# Patient Record
Sex: Female | Born: 1970 | Hispanic: Yes | Marital: Married | Smoking: Never smoker
Health system: Southern US, Community
[De-identification: ages and names within clinical notes are randomized; demographics above are authoritative.]

## PROBLEM LIST (undated history)

## (undated) DIAGNOSIS — R4702 Dysphasia: Secondary | ICD-10-CM

## (undated) HISTORY — PX: DIAGNOSTIC LAPAROSCOPY: SUR761

---

## 2008-12-10 ENCOUNTER — Ambulatory Visit: Payer: Self-pay | Admitting: Obstetrics and Gynecology

## 2009-09-14 ENCOUNTER — Ambulatory Visit: Payer: Self-pay | Admitting: Family Medicine

## 2010-01-08 ENCOUNTER — Ambulatory Visit: Payer: Self-pay | Admitting: Gastroenterology

## 2010-08-30 ENCOUNTER — Ambulatory Visit: Payer: Self-pay | Admitting: Obstetrics and Gynecology

## 2013-12-17 DIAGNOSIS — K589 Irritable bowel syndrome without diarrhea: Secondary | ICD-10-CM | POA: Insufficient documentation

## 2013-12-17 DIAGNOSIS — K2970 Gastritis, unspecified, without bleeding: Secondary | ICD-10-CM | POA: Insufficient documentation

## 2013-12-17 DIAGNOSIS — N809 Endometriosis, unspecified: Secondary | ICD-10-CM | POA: Insufficient documentation

## 2014-01-10 ENCOUNTER — Ambulatory Visit: Payer: Self-pay

## 2014-12-24 ENCOUNTER — Other Ambulatory Visit: Payer: Self-pay | Admitting: Obstetrics and Gynecology

## 2014-12-24 DIAGNOSIS — Z1231 Encounter for screening mammogram for malignant neoplasm of breast: Secondary | ICD-10-CM

## 2014-12-24 DIAGNOSIS — M6281 Muscle weakness (generalized): Secondary | ICD-10-CM | POA: Insufficient documentation

## 2015-01-12 ENCOUNTER — Ambulatory Visit
Admission: RE | Admit: 2015-01-12 | Discharge: 2015-01-12 | Disposition: A | Discharge status: Home / Self Care | Payer: BLUE CROSS/BLUE SHIELD | Source: Ambulatory Visit | Attending: Obstetrics and Gynecology | Admitting: Obstetrics and Gynecology

## 2015-01-12 DIAGNOSIS — R928 Other abnormal and inconclusive findings on diagnostic imaging of breast: Secondary | ICD-10-CM | POA: Insufficient documentation

## 2015-01-12 DIAGNOSIS — Z1231 Encounter for screening mammogram for malignant neoplasm of breast: Secondary | ICD-10-CM | POA: Diagnosis present

## 2015-01-21 ENCOUNTER — Other Ambulatory Visit: Payer: Self-pay | Admitting: Obstetrics and Gynecology

## 2015-01-21 DIAGNOSIS — R928 Other abnormal and inconclusive findings on diagnostic imaging of breast: Secondary | ICD-10-CM

## 2015-01-21 DIAGNOSIS — N6489 Other specified disorders of breast: Secondary | ICD-10-CM

## 2015-01-23 ENCOUNTER — Ambulatory Visit
Admission: RE | Admit: 2015-01-23 | Discharge: 2015-01-23 | Disposition: A | Discharge status: Home / Self Care | Payer: BLUE CROSS/BLUE SHIELD | Source: Ambulatory Visit | Attending: Obstetrics and Gynecology | Admitting: Obstetrics and Gynecology

## 2015-01-23 ENCOUNTER — Ambulatory Visit: Payer: BLUE CROSS/BLUE SHIELD

## 2015-01-23 DIAGNOSIS — N6489 Other specified disorders of breast: Secondary | ICD-10-CM

## 2015-01-23 DIAGNOSIS — R928 Other abnormal and inconclusive findings on diagnostic imaging of breast: Secondary | ICD-10-CM | POA: Diagnosis not present

## 2017-03-23 ENCOUNTER — Other Ambulatory Visit: Payer: Self-pay | Admitting: Obstetrics and Gynecology

## 2017-03-23 DIAGNOSIS — Z1231 Encounter for screening mammogram for malignant neoplasm of breast: Secondary | ICD-10-CM

## 2017-05-03 ENCOUNTER — Ambulatory Visit
Admission: RE | Admit: 2017-05-03 | Discharge: 2017-05-03 | Disposition: A | Discharge status: Home / Self Care | Payer: BLUE CROSS/BLUE SHIELD | Source: Ambulatory Visit | Attending: Obstetrics and Gynecology | Admitting: Obstetrics and Gynecology

## 2017-05-03 DIAGNOSIS — Z1231 Encounter for screening mammogram for malignant neoplasm of breast: Secondary | ICD-10-CM | POA: Diagnosis not present

## 2018-03-30 ENCOUNTER — Other Ambulatory Visit: Payer: Self-pay | Admitting: Obstetrics and Gynecology

## 2018-03-30 DIAGNOSIS — Z1231 Encounter for screening mammogram for malignant neoplasm of breast: Secondary | ICD-10-CM

## 2018-05-08 ENCOUNTER — Ambulatory Visit
Admission: RE | Admit: 2018-05-08 | Discharge: 2018-05-08 | Disposition: A | Discharge status: Home / Self Care | Payer: BLUE CROSS/BLUE SHIELD | Source: Ambulatory Visit | Attending: Obstetrics and Gynecology | Admitting: Obstetrics and Gynecology

## 2018-05-08 DIAGNOSIS — Z1231 Encounter for screening mammogram for malignant neoplasm of breast: Secondary | ICD-10-CM | POA: Insufficient documentation

## 2019-04-08 ENCOUNTER — Other Ambulatory Visit: Payer: Self-pay | Admitting: Obstetrics and Gynecology

## 2019-04-08 DIAGNOSIS — Z1231 Encounter for screening mammogram for malignant neoplasm of breast: Secondary | ICD-10-CM

## 2019-05-16 ENCOUNTER — Ambulatory Visit
Admission: RE | Admit: 2019-05-16 | Discharge: 2019-05-16 | Disposition: A | Discharge status: Home / Self Care | Payer: BC Managed Care – PPO | Source: Ambulatory Visit | Attending: Obstetrics and Gynecology | Admitting: Obstetrics and Gynecology

## 2019-05-16 DIAGNOSIS — Z1231 Encounter for screening mammogram for malignant neoplasm of breast: Secondary | ICD-10-CM | POA: Diagnosis present

## 2019-08-29 ENCOUNTER — Ambulatory Visit: Payer: BC Managed Care – PPO | Attending: Family

## 2019-08-29 ENCOUNTER — Ambulatory Visit: Payer: Self-pay

## 2019-08-29 DIAGNOSIS — Z23 Encounter for immunization: Secondary | ICD-10-CM

## 2019-08-29 NOTE — Progress Notes (Signed)
   Covid-19 Vaccination Clinic  Name:  Lynn Adams    MRN: [id number] DOB: 12/24/1970  08/29/2019  Ms. Lynn Adams was observed post Covid-19 immunization for 15 minutes without incident. She was provided with Vaccine Information Sheet and instruction to access the V-Safe system.   Ms. Lynn Adams was instructed to call 911 with any severe reactions post vaccine: Marland Kitchen Difficulty breathing  . Swelling of face and throat  . A fast heartbeat  . A bad rash all over body  . Dizziness and weakness   Immunizations Administered    Name Date Dose VIS Date Route   Moderna COVID-19 Vaccine 08/29/2019 11:07 AM 0.5 mL 05/28/2019 Intramuscular   Manufacturer: Moderna   Lot: [id number]   NDC: 62376-283-15

## 2019-10-01 ENCOUNTER — Ambulatory Visit: Payer: BC Managed Care – PPO | Attending: Family

## 2019-10-01 ENCOUNTER — Ambulatory Visit: Payer: Self-pay

## 2019-10-01 DIAGNOSIS — Z23 Encounter for immunization: Secondary | ICD-10-CM

## 2019-10-01 NOTE — Progress Notes (Signed)
   Covid-19 Vaccination Clinic  Name:  Lynn Adams    MRN: [id number] DOB: Oct 29, 1970  10/01/2019  Ms. Lynn Adams was observed post Covid-19 immunization for 15 minutes without incident. She was provided with Vaccine Information Sheet and instruction to access the V-Safe system.   Ms. Lynn Adams was instructed to call 911 with any severe reactions post vaccine: Marland Kitchen Difficulty breathing  . Swelling of face and throat  . A fast heartbeat  . A bad rash all over body  . Dizziness and weakness   Immunizations Administered    Name Date Dose VIS Date Route   Moderna COVID-19 Vaccine 10/01/2019  9:36 AM 0.5 mL 05/28/2019 Intramuscular   Manufacturer: Moderna   Lot: [id number]   NDC: 93734-287-68

## 2020-05-28 ENCOUNTER — Other Ambulatory Visit: Payer: Self-pay | Admitting: Obstetrics and Gynecology

## 2020-05-28 DIAGNOSIS — Z1231 Encounter for screening mammogram for malignant neoplasm of breast: Secondary | ICD-10-CM

## 2020-07-09 ENCOUNTER — Ambulatory Visit
Admission: RE | Admit: 2020-07-09 | Discharge: 2020-07-09 | Disposition: A | Discharge status: Home / Self Care | Payer: No Typology Code available for payment source | Source: Ambulatory Visit | Attending: Obstetrics and Gynecology | Admitting: Obstetrics and Gynecology

## 2020-07-09 ENCOUNTER — Other Ambulatory Visit: Payer: Self-pay

## 2020-07-09 DIAGNOSIS — Z1231 Encounter for screening mammogram for malignant neoplasm of breast: Secondary | ICD-10-CM | POA: Diagnosis not present

## 2021-01-26 ENCOUNTER — Ambulatory Visit: Payer: No Typology Code available for payment source | Admitting: Gastroenterology

## 2021-01-26 ENCOUNTER — Other Ambulatory Visit: Payer: Self-pay

## 2021-01-26 ENCOUNTER — Encounter: Payer: Self-pay | Admitting: Gastroenterology

## 2021-01-26 VITALS — BP 117/77 | HR 59 | Temp 98.4°F | Ht 65.0 in | Wt 115.6 lb

## 2021-01-26 DIAGNOSIS — R1084 Generalized abdominal pain: Secondary | ICD-10-CM

## 2021-01-26 DIAGNOSIS — R102 Pelvic and perineal pain: Secondary | ICD-10-CM | POA: Insufficient documentation

## 2021-01-26 DIAGNOSIS — K5900 Constipation, unspecified: Secondary | ICD-10-CM

## 2021-01-26 DIAGNOSIS — R0600 Dyspnea, unspecified: Secondary | ICD-10-CM

## 2021-01-26 DIAGNOSIS — Z8371 Family history of colonic polyps: Secondary | ICD-10-CM | POA: Diagnosis not present

## 2021-01-26 DIAGNOSIS — N882 Stricture and stenosis of cervix uteri: Secondary | ICD-10-CM | POA: Insufficient documentation

## 2021-01-26 MED ORDER — CLENPIQ 10-3.5-12 MG-GM -GM/160ML PO SOLN: ORAL | 0 refills | Status: DC

## 2021-01-26 NOTE — Addendum Note (Signed)
Addended by: Adela Ports on: 01/26/2021 11:16 AM   Modules accepted: Orders

## 2021-01-26 NOTE — Patient Instructions (Addendum)
Please take Linzess 72 mcg daily in the morning 30 minutes before breakfast. Please give [place] a call in 4-5 days to let us know if this medication does not work.Plan de alimentacin con bajo contenido de FODMAP Low-FODMAP Eating Plan  FODMAP significa oligosacridos, disacridos, monosacridos y polioles fermentables. Son azcares difciles de digerir para International aid/development worker. Un plan de alimentacin con bajo contenido de FODMAP puede ayudar a algunas personas que tienen el sndrome de colon irritable (SCI) y Materials engineer enfermedades de los intestinos (intestinales) a Chief Operating Officer sus sntomas. Seguir [employer] plan de alimentacin puede resultar complicado. Consulte con un especialista en dietas y nutricin (nutricionista) para disear un plan de alimentacin con bajo contenido de FODMAP que sea adecuado para usted. Un nutricionista puede asegurarse de que consumasuficientes nutrientes con este plan de alimentacin. Consejos para seguir Consulting civil engineer las etiquetas de los alimentos Lea las etiquetas para detectar FODMAP ocultos, por ejemplo: Jarabe de alta fructosa. Miel. Agave. Saborizantes de frutas naturales. Ajo o cebolla en polvo. Elija alimentos con bajo contenido de FODMAP que contengan 3 o 4 gramos de fibra por porcin. Consulte las etiquetas de los alimentos para Solicitor los tamaos de las porciones. Coma solo una porcin por vez para asegurarse de [employer] niveles de FODMAP. De compras Compre con una lista de alimentos recomendados para esta dieta y haga un plan de comidas. Planificacin de las comidas Siga un plan de alimentacin con bajo contenido de FODMAP durante un mximo de 6 semanas, o segn le indique el mdico o nutricionista. Para seguir el plan de alimentacin, haga lo siguiente: Elimine los alimentos con alto contenido de FODMAP de su dieta por completo. Seleccione slo alimentos bajos en FODMAP para comer. Lo har durante 2 a 6 semanas. Vuelva a introducir los alimentos con alto  contenido de FODMAP en su dieta gradualmente, de uno a la vez. En su mayora, la gente debe esperar unos das antes de introducir la siguiente comida con alto contenido de FODMAP en su plan de comidas. Su nutricionista puede recomendarle con qu rapidez debera volver a introducir los alimentos. Lleve un registro diario de qu y cunto come y bebe. Anote cualquier sntoma que tenga despus de comer. Revise su registro diario con el nutricionista regularmente para identificar qu alimentos puede comer y Administrator. Consejos generales Beba suficiente lquido todos los [address] para mantener la orina de color amarillo plido. No consuma alimentos procesados. Con frecuencia, estos alimentos tienen azcar agregada y pueden tener un alto contenido de FODMAP. Evite la [employer] de los productos lcteos, los cereales integrales y los endulzantes. Consulte con un nutricionista para asegurarse de incluir suficiente fibra en su alimentacin. Evite los alimentos con alto contenido de FODMAP en las comidas para controlar los sntomas. Alimentos recomendados [address], [place], [place], limones, limas, arndanos, frambuesas, fresas, uvas, meln, meln dulce, kiwi, papaya, maracuy y pia. Cantidades limitadasde arndanos rojos disecados, chips de banana y coco rallado. Verduras [place], calabacn, pepino, pimientos, judas verdes, brotes de soja, [place], rcula, kale, acelga, espinaca, col berza, col [place], [place], papa y [place]. Cantidades limitadas de maz, zanahoria y camote. Laparte verde de los cebollines. Granos Cereales sin gluten, como arroz, avena, trigo sarraceno, quinua, maz, polentay mijo. Pasta, pan y cereal sin gluten. Fideos de Surveyor, minerals. Tortillas de maz. Carnes y [address] protenas Carne de res, cerdo, aves o pescado sin Education officer, museum. Huevos. Tocino. Tofu (firme) y tempeh. Cantidades limitadas de frutos secos y [address], como almendras, nueces, nueces de [place],  [place], [place],  semillas de calabaza, semillas decha y semillas de girasol. Earna Coder, yogur y Charity fundraiser sin Advice worker. Queso requesn y helado sin Advice worker. Leches que no sean de origen animal, por ejemplo, de almendras, coco, camo y arroz. Yogur no lcteo. Cantidades limitadas de queso de [place], [place], [place], suizo y [employer]. Grasas y aceites Pastas para untar sin manteca. Aceites vegetales, como el de [place], de canola yde [place]. Condimentos y otros alimentos Endulzantes artificiales con nombres que no terminen en "ol", como aspartamo, sacarina y [place]. Doreen Beam de arce, azcar blanca, azcar sin refinar, azcar morena y melaza. Mayonesa, salsa de soja y [place]. [place], cilantro, perejil,romero y tomillo frescos. Bebidas Westley Hummer y agua mineral. Refrescos endulzados con azcar. Pequeas cantidades de [place] de naranjas o jugo de arndanos rojos. T negro y verde. La mayora de losvinos secos. Caf. Es posible que los productos que se enumeran ms [place] no constituyan una lista completa de los alimentos y las bebidas que puede tomar. Consulte a un nutricionista para obtener ms informacin. Alimentos que deben evitarse Frutas [place], [place], sanda, durazno, [place], [place], [place], mora, mora de [place], nectarina y Horticulturist, commercial, en forma fresca, disecada o en jugo. Agu[place]. Verduras Raz de [place], alcachofa, esprrago, repollo, arvejas, repollitos de [place], brcoli, guisantes, arvejas, hongos, apio y Counsellor. Cebollas, ajo,puerros y la parte blanca de los cebollines. Granos [place]igo, incluidos el kamut, trigo duro y [place]. Cebada y bulgur. Cuscs.Cereales a base de trigo. Fideos de trigo, pan, galletas saladas y pasteles. Carnes y otras protenas Carnes fritas o grasosas. Salchichas. Castaas de caj y pistachos. Soja, frijoles en salsa de tomate, frijoles negros, garbanzos, porotos colorados,habas, porotos blancos, lentejas, frijoles pintos y arvejas  partidas. Lcteos Leche, yogur, helado y Bentley blando. Crema y Optometrist. Salsas a base deleche. Natillas. Suero de [place]. La leche de soja. Condimentos y otros alimentos Cualquier goma de Theatre manager o caramelo sin azcar. Alimentos que contienen endulzantes artificiales tales como el sorbitol, manitol, isomalt o xilitol. Alimentos que contienen miel, jarabe de maz de alta fructosa o agave. Consom, caldo de verduras, caldo de res y caldo de [place]. Ajo y cebolla en polvo. Condimentos hechos con cebolla, como el hummus, chutney, pickles, salsa depepinillos, aderezo para ensaladas y [place]. Extracto de [place]. Bebidas Bebidas a base de endivias. Sustitutos del caf. T de manzanilla. T de hinojo. Vinos dulces o enriquecidos como el oporto o el Visual merchandiser. Refrescos dietticos hechos con isomalt, manitol, maltitol, sorbitol o xilitol. Jugo demanzana, pera y mango. Jugos con Doreen Beam de maz de alta fructosa. Es posible que los productos que se enumeran ms [place] no constituyan una lista completa de los alimentos y las bebidas que Personnel officer. Consulte a un nutricionista para obtener ms informacin. Resumen FODMAP significa oligosacridos, disacridos, monosacridos y polioles fermentables. Son azcares difciles de digerir para International aid/development worker. Un plan de alimentacin con bajo contenido de FODMAP es una dieta a corto plazo que ayuda a Paramedic los sntomas de ciertas enfermedades intestinales. Generalmente, el plan de alimentacin dura hasta 6 semanas. A continuacin, los alimentos con alto contenido de FODMAP se reintroducen gradualmente y de a uno. Esto puede ayudarle a Financial risk analyst qu alimentos pueden estar causando los sntomas. Un plan de alimentacin con bajo contenido de FODMAP puede ser complicado. Es aconsejable consultar a un nutricionista con experiencia en este tipo de plan. Esta informacin no tiene Theme park manager el consejo del mdico. Asegresede hacerle al mdico cualquier pregunta que  tenga. Document Revised: 12/20/2019 Document Reviewed: 12/20/2019 Elsevier Patient Education  2022 [employer].

## 2021-01-26 NOTE — Progress Notes (Signed)
[name] MD, MRCP(U.K) [address]  [address]  [place], [place] [address]  Main: [phone]  Fax: [phone]   Gastroenterology Consultation  Referring Provider:     Christeen Douglas, MD Primary Care Physician:  Christeen Douglas, MD Primary Gastroenterologist:  Dr. [name]  Reason for Consultation:     Dyspepsia and bloating        HPI:   Lynn Adams is a 50 y.o. y/o female referred for consultation & management  by Dr. Christeen Douglas, MD.    She is originally from [place] and the reason she is here today to see me is for bloating she states that she has had issues with bloating for many years.  She states that she develops bloating first thing in the morning when she wakes up appears that she looks pregnant.  Often relieved after a bowel movement but she does not have a bowel movement daily.  She does notice that her bloating symptoms are worse when she is constipated.  Has not tried any medications for the same.  Last colonoscopy many years back.  Father had colon polyps.  She recollect she has had H. pylori in the past.  She complains of epigastric pain nonradiating sharp in nature ongoing for many years.  Worse when she eats.  Denies any NSAID use.  Denies any weight loss.  No other GI symptoms.  No past medical history on file.  No past surgical history on file.  Prior to Admission medications   Not on File    Family History  Problem Relation Age of Onset   Breast cancer Neg Hx      Social History   Tobacco Use   Smoking status: Never    Passive exposure: Never   Smokeless tobacco: Never  Substance Use Topics   Alcohol use: Not Currently    Allergies as of 01/26/2021   (No Known Allergies)    Review of Systems:    All systems reviewed and negative except where noted in HPI.   Physical Exam:  BP 117/77   Pulse (!) 59   Temp 98.4 F (36.9 C) (Oral)   Ht 5\' 5"  (1.651 m)   Wt 115 lb 9.6 oz (52.4 kg)   BMI 19.24 kg/m  No LMP  recorded. (Menstrual status: IUD). Psych:  Alert and cooperative. Normal mood and affect. General:   Alert,  Well-developed, well-nourished, pleasant and cooperative in NAD Head:  Normocephalic and atraumatic. Eyes:  Sclera clear, no icterus.   Conjunctiva pink. Ears:  Normal auditory acuity. Lungs:  Respirations even and unlabored.  Clear throughout to auscultation.   No wheezes, crackles, or rhonchi. No acute distress. Heart:  Regular rate and rhythm; no murmurs, clicks, rubs, or gallops. Abdomen:  Normal bowel sounds.  No bruits.  Soft, non-tender and non-distended without masses, hepatosplenomegaly or hernias noted.  No guarding or rebound tenderness.    Neurologic:  Alert and oriented x3;  grossly normal neurologically. Psych:  Alert and cooperative. Normal mood and affect.  Imaging Studies: No results found.  Assessment and Plan:   Lynn Adams is a 50 y.o. y/o female has been referred for dyspepsia and bloating.  Very likely her symptoms of bloating are secondary to constipation.  And dyspepsia could be related to her H. pylori but has been going on for many months and would warrant evaluation.  I believe if her constipation is treated adequately her bloating symptoms would reduce.  Family history of  colon polyps.  Overdue for colonoscopy  Plan 1.  Low FODMAP diet 2.  Commenced on Linzess 72 mcg daily samples have been provided for 2 weeks. 3.  H. pylori breath test 4.  EGD and colonoscopy due to family history of colon polyps   I have discussed alternative options, risks & benefits,  which include, but are not limited to, bleeding, infection, perforation,respiratory complication & drug reaction.  The patient agrees with this plan & written consent will be obtained.    Follow up in 8 to 12 weeks  Dr [name] MD,MRCP(U.K)

## 2021-01-28 LAB — H. PYLORI BREATH TEST: H pylori Breath Test: NEGATIVE

## 2021-01-28 LAB — CELIAC DISEASE PANEL
Endomysial IgA: NEGATIVE
IgA/Immunoglobulin A, Serum: 234 mg/dL (ref 87–352)
Transglutaminase IgA: <2 U/mL (ref 0–3)

## 2021-03-23 ENCOUNTER — Telehealth: Payer: Self-pay

## 2021-03-23 NOTE — Telephone Encounter (Signed)
Pt. Would like to reschedule colonoscopy

## 2021-03-24 ENCOUNTER — Telehealth: Payer: Self-pay

## 2021-03-24 NOTE — Telephone Encounter (Signed)
CALLED PATIENT BACK SHE WANTED TO RESCHEDULE APPOINTMENT TO 04/15/2021 CALLED ENDO AND SENT NEW REFERREL TO [place]

## 2021-03-30 ENCOUNTER — Telehealth: Payer: Self-pay | Admitting: Gastroenterology

## 2021-03-30 NOTE — Telephone Encounter (Signed)
Pt. Requesting a call back she says her insurance will only cover her colonoscopy 100% and will only cover her Endoscopy 80% she wants to know is the endoscopy for prevention or diagnostic reasons.

## 2021-03-31 NOTE — Telephone Encounter (Signed)
Called patient back to let her know that her colonoscopy is for screening colonoscopy and her EGD is for abdominal bloating (diagnostic). So to please call us back in case she wanted to cancel the EGD or not.

## 2021-04-06 ENCOUNTER — Ambulatory Visit: Payer: No Typology Code available for payment source | Admitting: Gastroenterology

## 2021-04-15 ENCOUNTER — Ambulatory Visit
Admission: RE | Admit: 2021-04-15 | Payer: No Typology Code available for payment source | Source: Ambulatory Visit | Admitting: Gastroenterology

## 2021-04-15 ENCOUNTER — Encounter: Admission: RE | Payer: Self-pay | Source: Ambulatory Visit

## 2021-04-15 SURGERY — COLONOSCOPY WITH PROPOFOL
Anesthesia: General

## 2022-04-08 ENCOUNTER — Other Ambulatory Visit: Payer: Self-pay | Admitting: Obstetrics and Gynecology

## 2022-04-08 DIAGNOSIS — Z1231 Encounter for screening mammogram for malignant neoplasm of breast: Secondary | ICD-10-CM

## 2022-04-18 ENCOUNTER — Other Ambulatory Visit: Payer: Self-pay

## 2022-04-18 ENCOUNTER — Encounter: Payer: Self-pay | Admitting: Emergency Medicine

## 2022-04-18 ENCOUNTER — Emergency Department: Payer: No Typology Code available for payment source

## 2022-04-18 ENCOUNTER — Emergency Department
Admission: EM | Admit: 2022-04-18 | Discharge: 2022-04-18 | Disposition: A | Discharge status: Home / Self Care | Payer: No Typology Code available for payment source | Attending: Emergency Medicine | Admitting: Emergency Medicine

## 2022-04-18 DIAGNOSIS — R1013 Epigastric pain: Secondary | ICD-10-CM | POA: Diagnosis present

## 2022-04-18 DIAGNOSIS — K2900 Acute gastritis without bleeding: Secondary | ICD-10-CM | POA: Insufficient documentation

## 2022-04-18 LAB — CBC
HCT: 41.1 % (ref 36.0–46.0)
Hemoglobin: 13.4 g/dL (ref 12.0–15.0)
MCH: 31.2 pg (ref 26.0–34.0)
MCHC: 32.6 g/dL (ref 30.0–36.0)
MCV: 95.8 fL (ref 80.0–100.0)
Platelets: 291 10*3/uL (ref 150–400)
RBC: 4.29 MIL/uL (ref 3.87–5.11)
RDW: 12.2 % (ref 11.5–15.5)
WBC: 6.9 10*3/uL (ref 4.0–10.5)
nRBC: 0 % (ref 0.0–0.2)

## 2022-04-18 LAB — COMPREHENSIVE METABOLIC PANEL
ALT: 49 U/L — ABNORMAL HIGH (ref 0–44)
AST: 18 U/L (ref 15–41)
Albumin: 4.4 g/dL (ref 3.5–5.0)
Alkaline Phosphatase: 45 U/L (ref 38–126)
Anion gap: 6 (ref 5–15)
BUN: 10 mg/dL (ref 6–20)
CO2: 26 mmol/L (ref 22–32)
Calcium: 9.9 mg/dL (ref 8.9–10.3)
Chloride: 109 mmol/L (ref 98–111)
Creatinine, Ser: 0.49 mg/dL (ref 0.44–1.00)
GFR, Estimated: >60 mL/min (ref >60)
Glucose, Bld: 101 mg/dL — ABNORMAL HIGH (ref 70–99)
Potassium: 3.9 mmol/L (ref 3.5–5.1)
Sodium: 141 mmol/L (ref 135–145)
Total Bilirubin: 0.7 mg/dL (ref 0.3–1.2)
Total Protein: 7.7 g/dL (ref 6.5–8.1)

## 2022-04-18 LAB — URINALYSIS, ROUTINE W REFLEX MICROSCOPIC
Bilirubin Urine: NEGATIVE
Glucose, UA: NEGATIVE mg/dL
Hgb urine dipstick: NEGATIVE
Ketones, ur: NEGATIVE mg/dL
Leukocytes,Ua: NEGATIVE
Nitrite: NEGATIVE
Protein, ur: NEGATIVE mg/dL
Specific Gravity, Urine: 1.001 — ABNORMAL LOW (ref 1.005–1.030)
pH: 8 (ref 5.0–8.0)

## 2022-04-18 LAB — POC URINE PREG, ED: Preg Test, Ur: NEGATIVE

## 2022-04-18 LAB — LIPASE, BLOOD: Lipase: 64 U/L — ABNORMAL HIGH (ref 11–51)

## 2022-04-18 MED ORDER — SUCRALFATE 1 G PO TABS: 1.0000 g | ORAL_TABLET | Freq: Three times a day (TID) | ORAL | 0 refills | Status: DC

## 2022-04-18 MED ORDER — PANTOPRAZOLE SODIUM 40 MG PO TBEC: 40.0000 mg | DELAYED_RELEASE_TABLET | Freq: Every day | ORAL | 1 refills | Status: DC

## 2022-04-18 MED ORDER — ALUM & MAG HYDROXIDE-SIMETH 200-200-20 MG/5ML PO SUSP
30.0000 mL | Freq: Once | ORAL | Status: AC
  Administered 2022-04-18: 30 mL via ORAL
  Filled 2022-04-18: qty 30

## 2022-04-18 MED ORDER — IOHEXOL 300 MG/ML  SOLN
100.0000 mL | Freq: Once | INTRAMUSCULAR | Status: AC | PRN
  Administered 2022-04-18: 100 mL via INTRAVENOUS

## 2022-04-18 MED ORDER — KETOROLAC TROMETHAMINE 30 MG/ML IJ SOLN
30.0000 mg | Freq: Once | INTRAMUSCULAR | Status: AC
  Administered 2022-04-18: 30 mg via INTRAVENOUS
  Filled 2022-04-18: qty 1

## 2022-04-18 MED ORDER — ONDANSETRON HCL 4 MG/2ML IJ SOLN
4.0000 mg | Freq: Once | INTRAMUSCULAR | Status: AC
  Administered 2022-04-18: 4 mg via INTRAVENOUS
  Filled 2022-04-18: qty 2

## 2022-04-18 MED ORDER — MORPHINE SULFATE (PF) 4 MG/ML IV SOLN
4.0000 mg | Freq: Once | INTRAVENOUS | Status: DC
  Filled 2022-04-18: qty 1

## 2022-04-18 NOTE — ED Provider Notes (Signed)
[facility] Provider Note    Event Date/Time   First MD Initiated Contact with Patient 04/18/22 [phone]     (approximate)   History   Abdominal Pain   HPI  Lynn Adams is a 51 y.o. female who presents with complaints of epigastric abdominal pain.  Patient reports that started last night, waxed and waned all night long, on pain scale from 7-10.  No history of abdominal surgeries.  She has had bloating before but nothing like this.  She does state it radiates to her right shoulder blade.     Physical Exam   Triage Vital Signs: ED Triage Vitals  Enc Vitals Group     BP 04/18/22 0841 131/76     Pulse Rate 04/18/22 0841 79     Resp 04/18/22 0841 20     Temp 04/18/22 0841 98.7 F (37.1 C)     Temp src --      SpO2 04/18/22 0841 99 %     Weight 04/18/22 0840 58.5 kg (129 lb)     Height 04/18/22 0840 1.575 m (5\' 2" )     Head Circumference --      Peak Flow --      Pain Score 04/18/22 0840 6     Pain Loc --      Pain Edu? --      Excl. in [place]? --     Most recent vital signs: Vitals:   04/18/22 0841  BP: 131/76  Pulse: 79  Resp: 20  Temp: 98.7 F (37.1 C)  SpO2: 99%     General: Awake, no distress.  CV:  Good peripheral perfusion.  Resp:  Normal effort.  Abd:  No distention.  Tenderness palpation the right upper quadrant Other:     ED Results / Procedures / Treatments   Labs (all labs ordered are listed, but only abnormal results are displayed) Labs Reviewed  LIPASE, BLOOD - Abnormal; Notable for the following components:      Result Value   Lipase 64 (*)    All other components within normal limits  COMPREHENSIVE METABOLIC PANEL - Abnormal; Notable for the following components:   Glucose, Bld 101 (*)    ALT 49 (*)    All other components within normal limits  URINALYSIS, ROUTINE W REFLEX MICROSCOPIC - Abnormal; Notable for the following components:   Color, Urine COLORLESS (*)    APPearance CLEAR (*)    Specific  Gravity, Urine 1.001 (*)    All other components within normal limits  CBC  POC URINE PREG, ED     EKG  ED ECG REPORT I, Lavonia Drafts, the attending physician, personally viewed and interpreted this ECG.  Date: 04/18/2022  Rhythm: normal sinus rhythm QRS Axis: normal Intervals: normal ST/T Wave abnormalities: normal Narrative Interpretation: no evidence of acute ischemia    RADIOLOGY Ultrasound right upper quadrant viewed interpreted by me, no evidence of cholecystitis    PROCEDURES:  Critical Care performed:   Procedures   MEDICATIONS ORDERED IN ED: Medications  ondansetron (ZOFRAN) injection 4 mg (4 mg Intravenous Given 04/18/22 0910)  ketorolac (TORADOL) 30 MG/ML injection 30 mg (30 mg Intravenous Given 04/18/22 0922)  iohexol (OMNIPAQUE) 300 MG/ML solution 100 mL (100 mLs Intravenous Contrast Given 04/18/22 1004)  alum & mag hydroxide-simeth (MAALOX/MYLANTA) 200-200-20 MG/5ML suspension 30 mL (30 mLs Oral Given 04/18/22 1029)     IMPRESSION / MDM / ASSESSMENT AND PLAN / ED COURSE  I reviewed the triage  vital signs and the nursing notes. Patient's presentation is most consistent with acute presentation with potential threat to life or bodily function.  Patient presents with epigastric pain as described above.  Differential includes cholelithiasis/cholecystitis, pancreatitis, gastritis.  Tenderness palpation the right upper quadrant with radiation to the right shoulder blade, highly suspicious for gallbladder pathology.  Will give IV morphine, IV Zofran, obtain ultrasound of the right upper quadrant  CBC is returned normal, pending CMP  Ultrasound is negative for gallstones, mildly elevated lipase will send for CT to evaluate for possible pancreatitis  No evidence of inflammation on CT, LFTs are overall reassuring.  Patient treated with GI cocktail with significant improvement.  Suspicious for gastritis.  Will start on PPI, Carafate, outpatient follow-up  with GI, return precautions discussed, patient and family agree with this plan        FINAL CLINICAL IMPRESSION(S) / ED DIAGNOSES   Final diagnoses:  Acute gastritis without hemorrhage, unspecified gastritis type     Rx / DC Orders   ED Discharge Orders          Ordered    pantoprazole (PROTONIX) 40 MG tablet  Daily        04/18/22 1043    sucralfate (CARAFATE) 1 g tablet  3 times daily with meals & bedtime        04/18/22 1043             Note:  This document was prepared using Dragon voice recognition software and may include unintentional dictation errors.   Jene Every, MD 04/18/22 1045

## 2022-04-18 NOTE — ED Triage Notes (Signed)
Pt via POV from home. Pt c/o epigastric pain since last night that radiates around the R side. Denies abd surgeries. Denies any NV. Denies fever. Denies urinary symptoms. Pt is A&OX4 and NAD

## 2022-05-12 ENCOUNTER — Ambulatory Visit
Admission: RE | Admit: 2022-05-12 | Discharge: 2022-05-12 | Disposition: A | Discharge status: Home / Self Care | Payer: No Typology Code available for payment source | Source: Ambulatory Visit | Attending: Obstetrics and Gynecology | Admitting: Obstetrics and Gynecology

## 2022-05-12 DIAGNOSIS — Z1231 Encounter for screening mammogram for malignant neoplasm of breast: Secondary | ICD-10-CM | POA: Insufficient documentation

## 2022-05-31 ENCOUNTER — Other Ambulatory Visit: Payer: Self-pay

## 2022-05-31 ENCOUNTER — Encounter: Payer: Self-pay | Admitting: Gastroenterology

## 2022-05-31 ENCOUNTER — Ambulatory Visit: Payer: No Typology Code available for payment source | Admitting: Gastroenterology

## 2022-05-31 VITALS — BP 120/78 | HR 66 | Temp 98.8°F | Ht 62.0 in | Wt 130.5 lb

## 2022-05-31 DIAGNOSIS — Z83719 Family history of colon polyps, unspecified: Secondary | ICD-10-CM

## 2022-05-31 DIAGNOSIS — R1084 Generalized abdominal pain: Secondary | ICD-10-CM | POA: Diagnosis not present

## 2022-05-31 DIAGNOSIS — K5900 Constipation, unspecified: Secondary | ICD-10-CM

## 2022-05-31 DIAGNOSIS — R0600 Dyspnea, unspecified: Secondary | ICD-10-CM

## 2022-05-31 MED ORDER — NA SULFATE-K SULFATE-MG SULF 17.5-3.13-1.6 GM/177ML PO SOLN: 354.0000 mL | Freq: Once | ORAL | 0 refills | Status: AC

## 2022-05-31 MED ORDER — OMEPRAZOLE 40 MG PO CPDR: 40.0000 mg | DELAYED_RELEASE_CAPSULE | Freq: Every day | ORAL | 0 refills | Status: DC

## 2022-05-31 NOTE — Progress Notes (Unsigned)
[name] MD, MRCP(U.K) [address]  [address]  [place], [place] [address]  Main: [phone]  Fax: [phone]   Primary Care Physician: Christeen Douglas, MD  Primary Gastroenterologist:  Dr. [name]   Chief Complaint  Patient presents with   Acute gastritis    HPI: Lynn Adams is a 51 y.o. female   Summary of history :  History referred and seen in 02/13/2021 for dyspepsia and bloating.  She is from [place] and has had bloating for many years first thing in the morning when she wakes up and it made her appear to look pregnant.  Worse when she has been constipated and had not had a recent colonoscopy she had been treated for H. pylori in the past denied any weight loss.  Interval history   01/26/2021-05/31/2022  She is scheduled for an EGD and colonoscopy due to family history of polyps but did not go through it canceled the procedure  01/26/2021 H. pylori breath test and celiac serology were negative  04/18/2022: ER visit for abdominal discomfort bloating discharged home on GI cocktail and 04/18/2022 right upper quadrant ultrasound shows no abnormalities.,  Underwent a CT scan on the same day that showed no acute findings except a simple ovarian cyst  CMP showed ALT of 49 otherwise rest of the test were negative lipase was 64 urine analysis was negative.  She says she does not have a bowel movement that is soft is usually very hard but once a day and notices the symptoms are worse when she does not have a good bowel movement denies any NSAID use.  Current Outpatient Medications  Medication Sig Dispense Refill   diclofenac (VOLTAREN) 75 MG EC tablet Take 75 mg by mouth 2 (two) times daily.     meloxicam (MOBIC) 15 MG tablet Take 15 mg by mouth daily.     methocarbamol (ROBAXIN) 500 MG tablet Take 500 mg by mouth at bedtime.     pantoprazole (PROTONIX) 40 MG tablet Take 1 tablet (40 mg total) by mouth daily. 30 tablet 1   Sod Picosulfate-Mag Ox-Cit Acd  (CLENPIQ) 10-3.5-12 MG-GM -GM/160ML SOLN Take 1 bottle at 5 PM followed by five 8 oz cups of water and repeat 5 hours before procedure. 320 mL 0   thyroid (NP THYROID) 30 MG tablet Take 30 mg by mouth daily before breakfast.     sucralfate (CARAFATE) 1 g tablet Take 1 tablet (1 g total) by mouth 4 (four) times daily -  with meals and at bedtime for 15 days. 60 tablet 0   No current facility-administered medications for this visit.    Allergies as of 05/31/2022   (No Known Allergies)    ROS:  General: Negative for anorexia, weight loss, fever, chills, fatigue, weakness. ENT: Negative for hoarseness, difficulty swallowing , nasal congestion. CV: Negative for chest pain, angina, palpitations, dyspnea on exertion, peripheral edema.  Respiratory: Negative for dyspnea at rest, dyspnea on exertion, cough, sputum, wheezing.  GI: See history of present illness. GU:  Negative for dysuria, hematuria, urinary incontinence, urinary frequency, nocturnal urination.  Endo: Negative for unusual weight change.    Physical Examination:   BP 120/78   Pulse 66   Temp 98.8 F (37.1 C) (Oral)   Ht 5\' 2"  (1.575 m)   Wt 130 lb 8 oz (59.2 kg)   BMI 23.87 kg/m   General: Well-nourished, well-developed in no acute distress.  Eyes: No icterus. Conjunctivae pink. Neuro: Alert and oriented x  3.  Grossly intact. Skin: Warm and dry, no jaundice.   Psych: Alert and cooperative, normal mood and affect.   Imaging Studies: MM 3D SCREEN BREAST BILATERAL  Result Date: 05/16/2022 CLINICAL DATA:  Screening. EXAM: DIGITAL SCREENING BILATERAL MAMMOGRAM WITH TOMOSYNTHESIS AND CAD TECHNIQUE: Bilateral screening digital craniocaudal and mediolateral oblique mammograms were obtained. Bilateral screening digital breast tomosynthesis was performed. The images were evaluated with computer-aided detection. COMPARISON:  Previous exam(s). ACR Breast Density Category c: The breast tissue is heterogeneously dense, which may  obscure small masses. FINDINGS: There are no findings suspicious for malignancy. IMPRESSION: No mammographic evidence of malignancy. A result letter of this screening mammogram will be mailed directly to the patient. RECOMMENDATION: Screening mammogram in one year. (Code:SM-B-01Y) BI-RADS CATEGORY  1: Negative. Electronically Signed   By: Frederico Hamman M.D.   On: 05/16/2022 11:29    Assessment and Plan:   Lynn Adams is a 51 y.o. y/o female with a history of constipation and bloating for over 3 years seen last in 02/13/2021 was recommended a colonoscopy and endoscopy to evaluate the dyspepsia and bloating did not follow-up recently seen at the emergency room in 04/15/2022 for the same reason and here today to see me as a follow-up  Plan 1.  High-fiber diet [facility] information provided about the same 25 g of fiber per day 2.  Linzess 145 mcg samples will be provided for a week if it works she will call me and I will give her prescription for 90 days 3.  EGD and colonoscopy  I have discussed alternative options, risks & benefits,  which include, but are not limited to, bleeding, infection, perforation,respiratory complication & drug reaction.  The patient agrees with this plan & written consent will be obtained.     Dr [name]  MD,MRCP [facility]) Follow up in 4 weeks

## 2022-05-31 NOTE — Patient Instructions (Addendum)
Please take Linzess 145 MG capsule a day. Please let us know if it helps so Dr. Tobi Bastos could send you a prescription.  Please take Prilosec 40 MG capsules a day for 6 weeks and then stop. This will be sent to your pharmacy.

## 2022-06-01 ENCOUNTER — Encounter: Payer: Self-pay | Admitting: Gastroenterology

## 2022-06-07 ENCOUNTER — Encounter: Payer: Self-pay | Admitting: Gastroenterology

## 2022-06-08 ENCOUNTER — Ambulatory Visit: Payer: No Typology Code available for payment source | Admitting: Anesthesiology

## 2022-06-08 ENCOUNTER — Encounter
Admission: RE | Disposition: A | Discharge status: Home / Self Care | Payer: Self-pay | Source: Ambulatory Visit | Attending: Gastroenterology

## 2022-06-08 ENCOUNTER — Encounter: Payer: Self-pay | Admitting: Gastroenterology

## 2022-06-08 ENCOUNTER — Ambulatory Visit
Admission: RE | Admit: 2022-06-08 | Discharge: 2022-06-08 | Disposition: A | Discharge status: Home / Self Care | Payer: No Typology Code available for payment source | Source: Ambulatory Visit | Attending: Gastroenterology | Admitting: Gastroenterology

## 2022-06-08 DIAGNOSIS — Z83719 Family history of colon polyps, unspecified: Secondary | ICD-10-CM

## 2022-06-08 DIAGNOSIS — R140 Abdominal distension (gaseous): Secondary | ICD-10-CM

## 2022-06-08 DIAGNOSIS — K3189 Other diseases of stomach and duodenum: Secondary | ICD-10-CM | POA: Diagnosis not present

## 2022-06-08 DIAGNOSIS — R1084 Generalized abdominal pain: Secondary | ICD-10-CM

## 2022-06-08 DIAGNOSIS — K5900 Constipation, unspecified: Secondary | ICD-10-CM | POA: Diagnosis present

## 2022-06-08 DIAGNOSIS — R0600 Dyspnea, unspecified: Secondary | ICD-10-CM

## 2022-06-08 DIAGNOSIS — K2950 Unspecified chronic gastritis without bleeding: Secondary | ICD-10-CM | POA: Insufficient documentation

## 2022-06-08 HISTORY — PX: ESOPHAGOGASTRODUODENOSCOPY: SHX5428

## 2022-06-08 HISTORY — DX: Dysphasia: R47.02

## 2022-06-08 HISTORY — PX: COLONOSCOPY WITH PROPOFOL: SHX5780

## 2022-06-08 LAB — POCT PREGNANCY, URINE: Preg Test, Ur: NEGATIVE

## 2022-06-08 SURGERY — COLONOSCOPY WITH PROPOFOL
Anesthesia: General

## 2022-06-08 MED ORDER — PROPOFOL 500 MG/50ML IV EMUL
INTRAVENOUS | Status: DC | PRN
  Administered 2022-06-08: 150 ug/kg/min via INTRAVENOUS

## 2022-06-08 MED ORDER — DEXMEDETOMIDINE HCL IN NACL 80 MCG/20ML IV SOLN
INTRAVENOUS | Status: DC | PRN
  Administered 2022-06-08: 8 ug via BUCCAL

## 2022-06-08 MED ORDER — PROPOFOL 10 MG/ML IV BOLUS
INTRAVENOUS | Status: DC | PRN
  Administered 2022-06-08: 30 mg via INTRAVENOUS
  Administered 2022-06-08: 40 mg via INTRAVENOUS
  Administered 2022-06-08: 80 mg via INTRAVENOUS
  Administered 2022-06-08: 20 mg via INTRAVENOUS

## 2022-06-08 MED ORDER — SODIUM CHLORIDE 0.9 % IV SOLN: INTRAVENOUS | Status: DC

## 2022-06-08 MED ORDER — LIDOCAINE HCL (CARDIAC) PF 100 MG/5ML IV SOSY
PREFILLED_SYRINGE | INTRAVENOUS | Status: DC | PRN
  Administered 2022-06-08: 50 mg via INTRAVENOUS

## 2022-06-08 NOTE — Op Note (Signed)
[facility] Patient Name: Lynn Adams Procedure Date: 06/08/2022 12:59 PM MRN: [id number] Account #: [id number] Date of Birth: 24-Sep-1970 Admit Type: Outpatient Age: 51 Room: [facility] ENDO ROOM 3 Gender: Female Note Status: Finalized Instrument Name: Nelda Marseille [id number] Procedure:             Colonoscopy Indications:           Constipation Providers:             [name] MD, MD Medicines:             Monitored Anesthesia Care Complications:         No immediate complications. Procedure:             Pre-Anesthesia Assessment:                        - Prior to the procedure, a History and Physical was                         performed, and patient medications, allergies and                         sensitivities were reviewed. The patient's tolerance                         of previous anesthesia was reviewed.                        - The risks and benefits of the procedure and the                         sedation options and risks were discussed with the                         patient. All questions were answered and informed                         consent was obtained.                        - ASA Grade Assessment: II - A patient with mild                         systemic disease.                        After obtaining informed consent, the colonoscope was                         passed under direct vision. Throughout the procedure,                         the patient's blood pressure, pulse, and oxygen                         saturations were monitored continuously. The                         Colonoscope was introduced through the anus and  advanced to the the cecum, identified by the                         appendiceal orifice. The colonoscopy was performed                         with ease. The patient tolerated the procedure well.                         The quality of the bowel preparation was excellent.                          The ileocecal valve, appendiceal orifice, and rectum                         were photographed. Findings:      The perianal and digital rectal examinations were normal.      The entire examined colon appeared normal on direct and retroflexion       views. Impression:            - The entire examined colon is normal on direct and                         retroflexion views.                        - No specimens collected. Recommendation:        - Discharge patient to home (with escort).                        - Resume previous diet.                        - Continue present medications.                        - Repeat colonoscopy in 10 years for screening                         purposes.                        - Return to GI office as previously scheduled. Procedure Code(s):     --- Professional ---                        [phone], Colonoscopy, flexible; diagnostic, including                         collection of specimen(s) by brushing or washing, when                         performed (separate procedure) Diagnosis Code(s):     --- Professional ---                        K59.00, Constipation, unspecified CPT copyright 2022 American Medical Association. All rights reserved. The codes documented in this report are preliminary and upon coder review may  be revised to meet current compliance requirements. [name], MD [name] MD, MD 06/08/2022 1:41:58 PM This report  has been signed electronically. Number of Addenda: 0 Note Initiated On: 06/08/2022 12:59 PM Scope Withdrawal Time: 0 hours 7 minutes 39 seconds  Total Procedure Duration: 0 hours 12 minutes 10 seconds  Estimated Blood Loss:  Estimated blood loss: none.      [facility]

## 2022-06-08 NOTE — Anesthesia Postprocedure Evaluation (Signed)
Anesthesia Post Note  Patient: Lynn Adams  Procedure(s) Performed: COLONOSCOPY WITH PROPOFOL ESOPHAGOGASTRODUODENOSCOPY (EGD)  Patient location during evaluation: Endoscopy Anesthesia Type: General Level of consciousness: awake and alert Pain management: pain level controlled Vital Signs Assessment: post-procedure vital signs reviewed and stable Respiratory status: spontaneous breathing, nonlabored ventilation, respiratory function stable and patient connected to nasal cannula oxygen Cardiovascular status: blood pressure returned to baseline and stable Postop Assessment: no apparent nausea or vomiting Anesthetic complications: no   No notable events documented.   Last Vitals:  Vitals:   06/08/22 1346 06/08/22 1355  BP: (!) 89/50 99/62  Pulse: (!) 51 (!) 56  Resp: 17 16  Temp:    SpO2: 98% 100%    Last Pain:  Vitals:   06/08/22 1345  TempSrc: Temporal  PainSc: Asleep                 Corinda Gubler

## 2022-06-08 NOTE — Anesthesia Preprocedure Evaluation (Signed)
Anesthesia Evaluation  Patient identified by MRN, date of birth, ID band Patient awake    Reviewed: Allergy & Precautions, NPO status , Patient's Chart, lab work & pertinent test results  History of Anesthesia Complications Negative for: history of anesthetic complications  Airway Mallampati: II  TM Distance: >3 FB Neck ROM: Full    Dental no notable dental hx. (+) Teeth Intact   Pulmonary neg pulmonary ROS, neg sleep apnea, neg COPD, Patient abstained from smoking.Not current smoker   Pulmonary exam normal breath sounds clear to auscultation       Cardiovascular Exercise Tolerance: Good METS(-) hypertension(-) CAD and (-) Past MI negative cardio ROS (-) dysrhythmias  Rhythm:Regular Rate:Normal - Systolic murmurs    Neuro/Psych negative neurological ROS  negative psych ROS   GI/Hepatic ,neg GERD  ,,(+)     (-) substance abuse    Endo/Other  neg diabetes    Renal/GU negative Renal ROS     Musculoskeletal   Abdominal   Peds  Hematology   Anesthesia Other Findings Past Medical History: No date: Dysphagia  Reproductive/Obstetrics                              Anesthesia Physical Anesthesia Plan  ASA: 2  Anesthesia Plan: General   Post-op Pain Management: Minimal or no pain anticipated   Induction: Intravenous  PONV Risk Score and Plan: 3 and Propofol infusion, TIVA and Ondansetron  Airway Management Planned: Nasal Cannula  Additional Equipment: None  Intra-op Plan:   Post-operative Plan:   Informed Consent: I have reviewed the patients History and Physical, chart, labs and discussed the procedure including the risks, benefits and alternatives for the proposed anesthesia with the patient or authorized representative who has indicated his/her understanding and acceptance.     Dental advisory given  Plan Discussed with: CRNA and Surgeon  Anesthesia Plan Comments:  (Discussed risks of anesthesia with patient, including possibility of difficulty with spontaneous ventilation under anesthesia necessitating airway intervention, PONV, and rare risks such as cardiac or respiratory or neurological events, and allergic reactions. Discussed the role of CRNA in patient's perioperative care. Patient understands.)         Anesthesia Quick Evaluation

## 2022-06-08 NOTE — Op Note (Signed)
[facility] Patient Name: Lynn Adams Procedure Date: 06/08/2022 12:59 PM MRN: [id number] Account #: [id number] Date of Birth: Jan 23, 1971 Admit Type: Outpatient Age: 51 Room: [facility] ENDO ROOM 3 Gender: Female Note Status: Finalized Instrument Name: Upper Endoscope [id number] Procedure:             Upper GI endoscopy Indications:           Abdominal bloating Providers:             [name] MD, MD Medicines:             Monitored Anesthesia Care Complications:         No immediate complications. Procedure:             Pre-Anesthesia Assessment:                        - Prior to the procedure, a History and Physical was                         performed, and patient medications, allergies and                         sensitivities were reviewed. The patient's tolerance                         of previous anesthesia was reviewed.                        - The risks and benefits of the procedure and the                         sedation options and risks were discussed with the                         patient. All questions were answered and informed                         consent was obtained.                        - ASA Grade Assessment: II - A patient with mild                         systemic disease.                        After obtaining informed consent, the endoscope was                         passed under direct vision. Throughout the procedure,                         the patient's blood pressure, pulse, and oxygen                         saturations were monitored continuously. The                         Endosonoscope was introduced through the mouth, and  advanced to the third part of duodenum. The upper GI                         endoscopy was accomplished with ease. The patient                         tolerated the procedure well. Findings:      The examined duodenum was normal.      The esophagus was  normal.      The cardia and gastric fundus were normal on retroflexion.      Diffuse moderately erythematous mucosa without bleeding was found on the       greater curvature of the stomach. Biopsies were taken with a cold       forceps for histology. Impression:            - Normal examined duodenum.                        - Normal esophagus.                        - Erythematous mucosa in the greater curvature.                         Biopsied. Recommendation:        - Await pathology results.                        - Perform a colonoscopy today. Procedure Code(s):     --- Professional ---                        [phone], Esophagogastroduodenoscopy, flexible,                         transoral; with biopsy, single or multiple Diagnosis Code(s):     --- Professional ---                        K31.89, Other diseases of stomach and duodenum                        R14.0, Abdominal distension (gaseous) CPT copyright 2022 American Medical Association. All rights reserved. The codes documented in this report are preliminary and upon coder review may  be revised to meet current compliance requirements. [name], MD [name] MD, MD 06/08/2022 1:26:51 PM This report has been signed electronically. Number of Addenda: 0 Note Initiated On: 06/08/2022 12:59 PM Estimated Blood Loss:  Estimated blood loss: none.      [facility]

## 2022-06-08 NOTE — H&P (Signed)
Lynn Bellows, MD [address] [place], [place], [place], [address] [address], [place], [place], [place], [address] Phone: [phone]  Fax: [phone]  Primary Care Physician:  Lynn Kindler, MD   Pre-Procedure History & Physical: HPI:  Lynn Adams is a 51 y.o. female is here for an endoscopy and colonoscopy    Past Medical History:  Diagnosis Date   Dysphagia     Past Surgical History:  Procedure Laterality Date   DIAGNOSTIC LAPAROSCOPY      Prior to Admission medications   Medication Sig Start Date End Date Taking? Authorizing Provider  meloxicam (MOBIC) 15 MG tablet Take 15 mg by mouth daily. 05/26/22  Yes [provider]  omeprazole (PRILOSEC) 40 MG capsule Take 1 capsule (40 mg total) by mouth daily. 05/31/22  Yes Lynn Bellows, MD  diclofenac (VOLTAREN) 75 MG EC tablet Take 75 mg by mouth 2 (two) times daily. 11/10/21 11/10/22  [provider]  methocarbamol (ROBAXIN) 500 MG tablet Take 500 mg by mouth at bedtime. 04/29/22   [provider]  Sod Picosulfate-Mag Ox-Cit Acd (CLENPIQ) 10-3.5-12 MG-GM -GM/160ML SOLN Take 1 bottle at 5 PM followed by five 8 oz cups of water and repeat 5 hours before procedure. 01/26/21   Lynn Bellows, MD  sucralfate (CARAFATE) 1 g tablet Take 1 tablet (1 g total) by mouth 4 (four) times daily -  with meals and at bedtime for 15 days. 04/18/22 05/03/22  Lynn Drafts, MD  thyroid (NP THYROID) 30 MG tablet Take 30 mg by mouth daily before breakfast. 05/25/21   [provider]    Allergies as of 06/01/2022   (No Known Allergies)    Family History  Problem Relation Age of Onset   Breast cancer Neg Hx     Social History   Socioeconomic History   Marital status: Married    Spouse name: Not on file   Number of children: Not on file   Years of education: Not on file   Highest education level: Not on file  Occupational History   Not on file  Tobacco Use   Smoking status: Never     Passive exposure: Never   Smokeless tobacco: Never  Vaping Use   Vaping Use: Never used  Substance and Sexual Activity   Alcohol use: Not Currently   Drug use: Never   Sexual activity: Not on file  Other Topics Concern   Not on file  Social History Narrative   Not on file   Social Determinants of Health   Financial Resource Strain: Not on file  Food Insecurity: Not on file  Transportation Needs: Not on file  Physical Activity: Not on file  Stress: Not on file  Social Connections: Not on file  Intimate Partner Violence: Not on file    Review of Systems: See HPI, otherwise negative ROS  Physical Exam: BP 129/78   Pulse 71   Temp (!) 94.5 F (34.7 C) (Temporal)   Resp 16   Ht 5' (1.524 m)   Wt 57.1 kg   SpO2 100%   BMI 24.57 kg/m  General:   Alert,  pleasant and cooperative in NAD Head:  Normocephalic and atraumatic. Neck:  Supple; no masses or thyromegaly. Lungs:  Clear throughout to auscultation, normal respiratory effort.    Heart:  +S1, +S2, Regular rate and rhythm, No edema. Abdomen:  Soft, nontender and nondistended. Normal bowel sounds, without guarding, and without rebound.   Neurologic:  Alert and  oriented x4;  grossly normal neurologically.  Impression/Plan: Lynn Adams is here for an endoscopy and colonoscopy  to be performed for  evaluation of bloating and constipation    Risks, benefits, limitations, and alternatives regarding endoscopy have been reviewed with the patient.  Questions have been answered.  All parties agreeable.   [name], MD  06/08/2022, 1:02 PM

## 2022-06-08 NOTE — Anesthesia Procedure Notes (Signed)
Date/Time: 06/08/2022 1:15 PM  Performed by: Ginger Carne, CRNAPre-anesthesia Checklist: Patient identified, Emergency Drugs available, Suction available, Patient being monitored and Timeout performed Patient Re-evaluated:Patient Re-evaluated prior to induction Oxygen Delivery Method: Nasal cannula Preoxygenation: Pre-oxygenation with 100% oxygen Induction Type: IV induction

## 2022-06-08 NOTE — Transfer of Care (Signed)
Immediate Anesthesia Transfer of Care Note  Patient: Lynn Adams  Procedure(s) Performed: COLONOSCOPY WITH PROPOFOL ESOPHAGOGASTRODUODENOSCOPY (EGD)  Patient Location: Endoscopy Unit  Anesthesia Type:General  Level of Consciousness: sedated  Airway & Oxygen Therapy: Patient Spontanous Breathing  Post-op Assessment: Report given to RN and Post -op Vital signs reviewed and stable  Post vital signs: Reviewed and stable  Last Vitals:  Vitals Value Taken Time  BP 89/50 06/08/22 1345  Temp 35.7 C 06/08/22 1345  Pulse 51 06/08/22 1346  Resp 16 06/08/22 1346  SpO2 99 % 06/08/22 1346  Vitals shown include unvalidated device data.  Last Pain:  Vitals:   06/08/22 1345  TempSrc: Temporal  PainSc: Asleep         Complications: No notable events documented.

## 2022-06-09 ENCOUNTER — Encounter: Payer: Self-pay | Admitting: Gastroenterology

## 2022-06-10 LAB — SURGICAL PATHOLOGY

## 2022-06-13 ENCOUNTER — Encounter: Payer: Self-pay | Admitting: Gastroenterology

## 2022-06-30 ENCOUNTER — Ambulatory Visit (INDEPENDENT_AMBULATORY_CARE_PROVIDER_SITE_OTHER): Payer: No Typology Code available for payment source | Admitting: Gastroenterology

## 2022-06-30 ENCOUNTER — Encounter: Payer: Self-pay | Admitting: Gastroenterology

## 2022-06-30 VITALS — BP 122/72 | HR 73 | Temp 98.3°F | Wt 131.2 lb

## 2022-06-30 DIAGNOSIS — R140 Abdominal distension (gaseous): Secondary | ICD-10-CM

## 2022-06-30 DIAGNOSIS — K5900 Constipation, unspecified: Secondary | ICD-10-CM

## 2022-06-30 NOTE — Progress Notes (Signed)
Jonathon Bellows MD, MRCP(U.K) [address]  [place]  [place], [place] [address]  Main: [phone]  Fax: [phone]   Primary Care Physician: Benjaman Kindler, MD  Primary Gastroenterologist:  Dr. Jonathon Bellows   Chief Complaint  Patient presents with   Constipation    HPI: Jacob Chamblee is a 52 y.o. female  Summary of history :   History referred and seen in 02/13/2021 for dyspepsia and bloating.  She is from [place] and has had bloating for many years first thing in the morning when she wakes up and it made her appear to look pregnant.  Worse when she has been constipated and had not had a recent colonoscopy she had been treated for H. pylori in the past denied any weight loss. 01/26/2021 H. pylori breath test and celiac serology were negative   04/18/2022: ER visit for abdominal discomfort bloating discharged home on GI cocktail and 04/18/2022 right upper quadrant ultrasound shows no abnormalities.,  Underwent a CT scan on the same day that showed no acute findings except a simple ovarian cyst   CMP showed ALT of 49 otherwise rest of the test were negative lipase was 64 urine analysis was negative.   Interval history  05/31/2022-06/30/2021     06/08/2022: EGD : gastritis, colonoscopy was normal.    Since that visit she increase that amount of fiber in her diet and has not had constipation.  She has not needed to take the Linzess.  Abdominal pain is better.  She does suffer from bloating.  Complains of abdominal distention when she wakes up in the morning.  She has been using probiotics and drinking cranberry juice.  Denies any artificial sugars or sweeteners in her diet.   No current outpatient medications on file.   No current facility-administered medications for this visit.    Allergies as of 06/30/2022   (No Known Allergies)    ROS:  General: Negative for anorexia, weight loss, fever, chills, fatigue, weakness. ENT: Negative for hoarseness, difficulty  swallowing , nasal congestion. CV: Negative for chest pain, angina, palpitations, dyspnea on exertion, peripheral edema.  Respiratory: Negative for dyspnea at rest, dyspnea on exertion, cough, sputum, wheezing.  GI: See history of present illness. GU:  Negative for dysuria, hematuria, urinary incontinence, urinary frequency, nocturnal urination.  Endo: Negative for unusual weight change.    Physical Examination:   BP 122/72   Pulse 73   Temp 98.3 F (36.8 C) (Oral)   Wt 131 lb 3.2 oz (59.5 kg)   BMI 25.62 kg/m   General: Well-nourished, well-developed in no acute distress.  Eyes: No icterus. Conjunctivae pink. Mouth: Oropharyngeal mucosa moist and pink , no lesions erythema or exudate. Lungs: Clear to auscultation bilaterally. Non-labored. Heart: Regular rate and rhythm, no murmurs rubs or gallops.  Abdomen: Bowel sounds are normal, nontender, nondistended, no hepatosplenomegaly or masses, no abdominal bruits or hernia , no rebound or guarding.   Extremities: No lower extremity edema. No clubbing or deformities. Neuro: Alert and oriented x 3.  Grossly intact. Skin: Warm and dry, no jaundice.   Psych: Alert and cooperative, normal mood and affect.   Imaging Studies: No results found.  Assessment and Plan:   Chelsi Warr is a 52 y.o. 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  EGD and colonoscopy showed no abnormality.   Plan 1.  Continue high-fiber diet, discussed that she may have SIBO or a combination  of SIBO and IBS constipation.  In addition the effects of probiotics and cranberry juice may be causing bloating.  Advised to use charcoal tablets as needed and provided information on low FODMAP diet for [facility] in [place].  Discussed that last option would be to use antibiotics and would generally try to avoid antibiotics.  For her constipation since it is not much of an issue at this  point of time advised to stop Linzess and use MiraLAX as needed.   Dr Jonathon Bellows  MD,MRCP [facility]) Follow up in as needed

## 2022-06-30 NOTE — Patient Instructions (Signed)
Por Favor tome Miralax cuando lo necesite si es estreida.  Plan de alimentacin con bajo contenido de FODMAP   FODMAP significa oligosacridos, disacridos, monosacridos y polioles fermentables. Son azcares difciles de digerir para Psychologist, clinical. Un plan de alimentacin con bajo contenido de FODMAP puede ayudar a algunas personas que tienen el sndrome de colon irritable (SCI) y Arboriculturist enfermedades de los intestinos (intestinales) a Chief Technology Officer sus sntomas. Seguir [place] [employer] plan de alimentacin puede resultar complicado. Consulte con un especialista en dietas y nutricin (nutricionista) para disear un plan de alimentacin con bajo contenido de FODMAP que sea adecuado para usted. Un nutricionista puede asegurarse de que consuma suficientes nutrientes con este plan de alimentacin. Consejos para seguir Photographer las etiquetas de los alimentos Lea las etiquetas para detectar FODMAP ocultos, por ejemplo: Jarabe de alta fructosa. Miel. Agave. Saborizantes de frutas naturales. Ajo o cebolla en polvo. Elija alimentos con bajo contenido de FODMAP que contengan 3 o 4 gramos de fibra por porcin. Consulte las etiquetas de los alimentos para Civil engineer, contracting los tamaos de las porciones. Coma solo una porcin por vez para asegurarse de [employer] niveles de FODMAP. De compras Compre con una lista de alimentos recomendados para esta dieta y haga un plan de comidas. Planificacin de las comidas Siga un plan de alimentacin con bajo contenido de FODMAP durante un mximo de 6 semanas, o segn le indique el mdico o nutricionista. Para seguir el plan de alimentacin, haga lo siguiente: Elimine los alimentos con alto contenido de FODMAP de su dieta por completo. Seleccione slo alimentos bajos en FODMAP para comer. Lo har durante 2 a 6 semanas. Vuelva a introducir los alimentos con alto contenido de FODMAP en su dieta gradualmente, de uno a la vez. En su mayora, la gente debe esperar unos das antes  de introducir la siguiente comida con alto contenido de FODMAP en su plan de comidas. Su nutricionista puede recomendarle con qu rapidez debera volver a introducir los alimentos. Lleve un registro diario de qu y cunto come y bebe. Anote cualquier sntoma que tenga despus de comer. Revise su registro diario con el nutricionista regularmente para identificar qu alimentos puede comer y Programmer, applications. Consejos generales Beba suficiente lquido todos los das como para mantener la orina de color amarillo plido. No consuma alimentos procesados. Con frecuencia, estos alimentos tienen azcar agregada y pueden tener un alto contenido de FODMAP. Evite la [employer] de los productos lcteos, los cereales integrales y los endulzantes. Consulte con un nutricionista para asegurarse de incluir suficiente fibra en su alimentacin. Evite los alimentos con alto contenido de FODMAP en las comidas para controlar los sntomas. Alimentos recomendados Frutas Bananas, [place], [place], limones, limas, arndanos, frambuesas, fresas, uvas, meln, meln dulce, kiwi, papaya, maracuy y pia. Cantidades limitadas de arndanos rojos disecados, chips de banana y coco rallado. Verduras [place], calabacn, pepino, pimientos, judas verdes, brotes de soja, [place], rcula, kale, acelga, espinaca, col berza, col [place], [place], papa y [place]. Cantidades limitadas de maz, zanahoria y camote. La parte verde de los cebollines. Granos Cereales sin gluten, como arroz, avena, trigo sarraceno, quinua, maz, polenta y mijo. Pasta, pan y cereal sin gluten. Fideos de Occupational psychologist. Tortillas de maz. Carnes y otras protenas Carne de res, cerdo, aves o pescado sin Sports administrator. Huevos. Tocino. Tofu (firme) y tempeh. Cantidades limitadas de frutos secos y semillas, como almendras, nueces, nueces de [place], [place], [place], semillas de [place], semillas de cha y semillas de [place]. Cottie Banda, yogur y English as a second language teacher sin  Comptroller.  Queso requesn y helado sin Comptroller. Leches que no sean de origen animal, por ejemplo, de almendras, coco, camo y arroz. Yogur no lcteo. Cantidades limitadas de queso de [place], [place], [place], parmesano, suizo y [employer]. Grasas y aceites Pastas para untar sin manteca. Aceites vegetales, como el de [place], de canola y de [place]. Condimentos y otros alimentos Endulzantes artificiales con nombres que no terminen en "ol", como aspartamo, sacarina y [place]. Chrissie Noa de arce, azcar blanca, azcar sin refinar, azcar morena y melaza. [place], salsa de soja y [place]. [place], cilantro, perejil, romero y [employer]. Bebidas Grayce Sessions y agua mineral. Refrescos endulzados con azcar. Pequeas cantidades de [place] de naranjas o jugo de arndanos rojos. T negro y verde. La mayora de los vinos secos. Caf. Es posible que los productos que se enumeran ms [place] no constituyan una lista completa de los alimentos y las bebidas que puede tomar. Consulte a un nutricionista para obtener ms informacin. Alimentos que deben evitarse [place], [place], sanda, durazno, [place], [place], [place], mora, mora de [place], higo, nectarina y Writer, en forma fresca, disecada o en jugo. [place]. Verduras Raz de [place], alcachofa, esprrago, repollo, arvejas, repollitos de [place], brcoli, guisantes, arvejas, hongos, apio y Dance movement psychotherapist. Cebollas, ajo, puerros y la parte blanca de los cebollines. Granos Trigo, incluidos el kamut, trigo duro y [place]ie. Cebada y bulgur. Cuscs. Cereales a base de trigo. Fideos de trigo, pan, galletas saladas y pasteles. Carnes y otras protenas Carnes fritas o grasosas. Salchichas. Castaas de caj y pistachos. Soja, frijoles en salsa de tomate, frijoles negros, garbanzos, porotos colorados, habas, porotos blancos, lentejas, frijoles pintos y arvejas partidas. Lcteos Leche, yogur, helado y [place]. Crema y Tax inspector. Salsas a base de [place]. Natillas. Suero de  [place]. La leche de soja. Condimentos y otros alimentos Cualquier goma de Higher education careers adviser o caramelo sin azcar. Alimentos que contienen endulzantes artificiales tales como el sorbitol, manitol, isomalt o xilitol. Alimentos que contienen miel, jarabe de maz de alta fructosa o agave. Consom, caldo de verduras, caldo de res y [place]. Ajo y cebolla en polvo. Condimentos hechos con cebolla, como el hummus, chutney, pickles, salsa de pepinillos, aderezo para ensaladas y [place]. Extracto de [place]. Bebidas Bebidas a base de endivias. Sustitutos del caf. T de manzanilla. T de hinojo. Vinos dulces o enriquecidos como el oporto o el Industrial/product designer. Refrescos dietticos hechos con isomalt, manitol, maltitol, sorbitol o xilitol. Jugo de [place], pera y mango. Jugos con Chrissie Noa de maz de alta fructosa. Es posible que los productos que se enumeran ms [place] no constituyan una lista completa de los alimentos y las bebidas que Nurse, adult. Consulte a un nutricionista para obtener ms informacin. Resumen FODMAP significa oligosacridos, disacridos, monosacridos y polioles fermentables. Son azcares difciles de digerir para Psychologist, clinical. Un plan de alimentacin con bajo contenido de FODMAP es una dieta a corto plazo que ayuda a Public house manager los sntomas de ciertas enfermedades intestinales. Generalmente, el plan de alimentacin dura hasta 6 semanas. A continuacin, los alimentos con alto contenido de FODMAP se reintroducen gradualmente y de a uno. Esto puede ayudarle a Neurosurgeon qu alimentos pueden estar causando los sntomas. Un plan de alimentacin con bajo contenido de FODMAP puede ser complicado. Es aconsejable consultar a un nutricionista con experiencia en este tipo de plan. Esta informacin no tiene Marine scientist el consejo del mdico. Asegrese de hacerle al mdico cualquier pregunta que tenga. Document Revised: 12/20/2019 Document Reviewed: 12/20/2019 Elsevier Patient Education  [place].

## 2022-07-03 ENCOUNTER — Other Ambulatory Visit: Payer: Self-pay | Admitting: Gastroenterology

## 2022-08-10 ENCOUNTER — Ambulatory Visit: Payer: No Typology Code available for payment source | Admitting: Gastroenterology

## 2023-01-06 IMAGING — MG DIGITAL SCREENING BILAT W/ TOMO W/ CAD
8 series · 8 of 24 positions shown · non-contrast
Comparison: Previous exam(s).

CLINICAL DATA: Screening.

EXAM:
DIGITAL SCREENING BILATERAL MAMMOGRAM WITH TOMO AND CAD

[L MLO synth-2D]
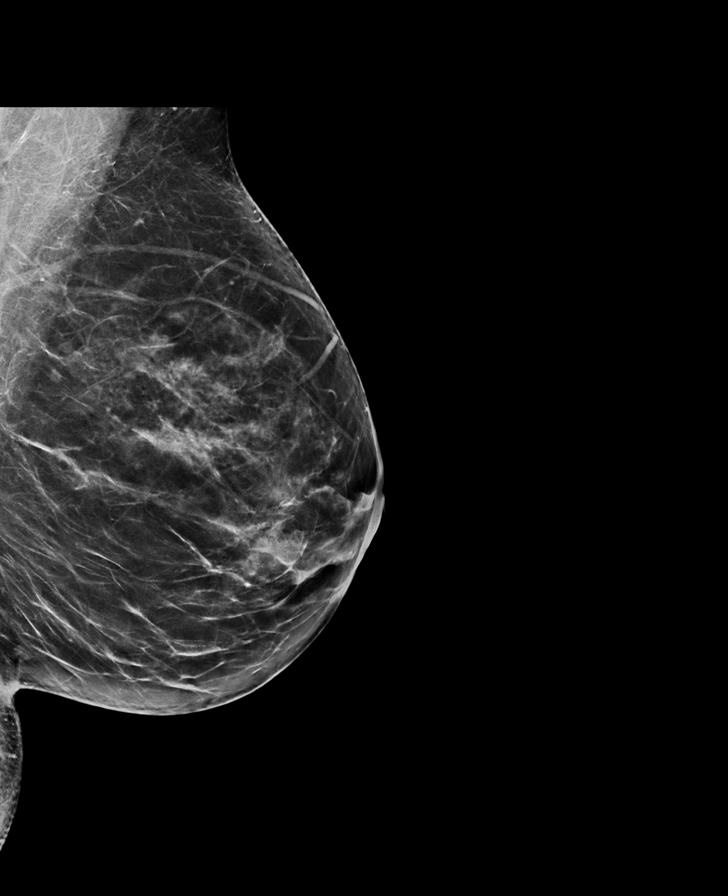

[R MLO synth-2D]
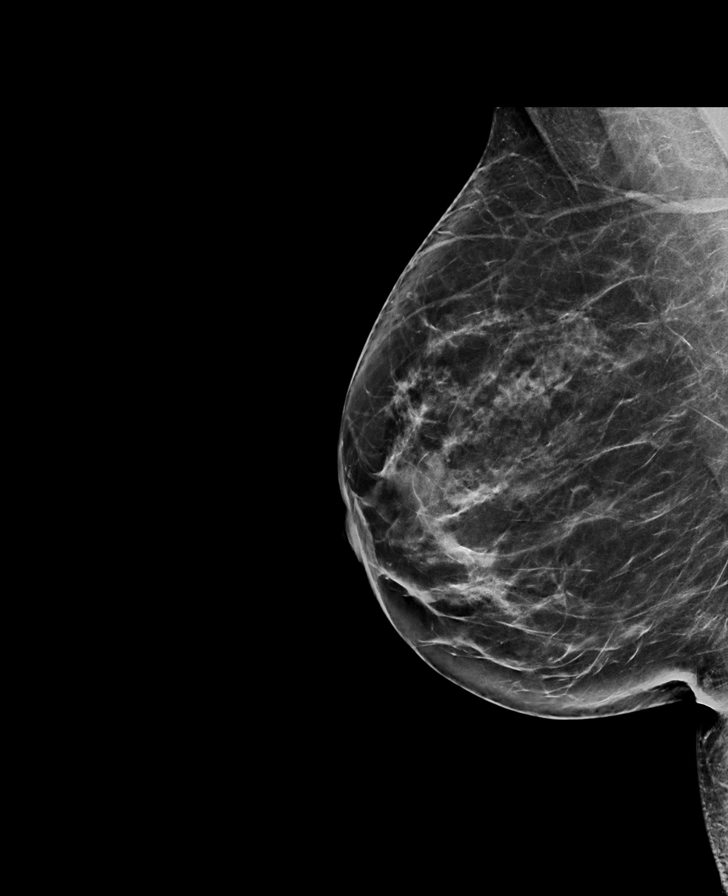

[L CC synth-2D]
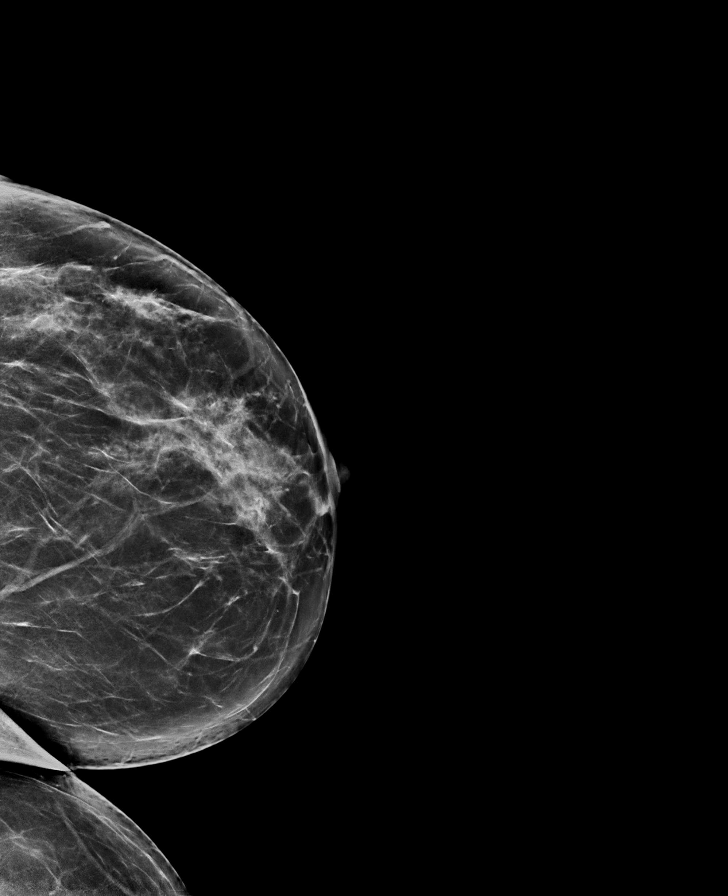

[R CC synth-2D]
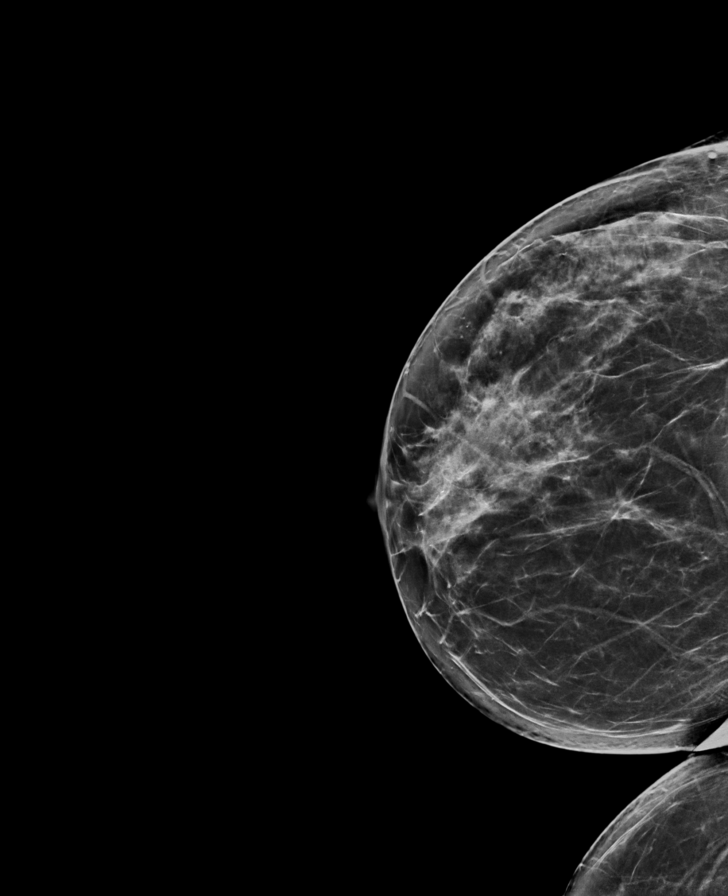

[L MLO tomo · tomo slice 40/79.0]
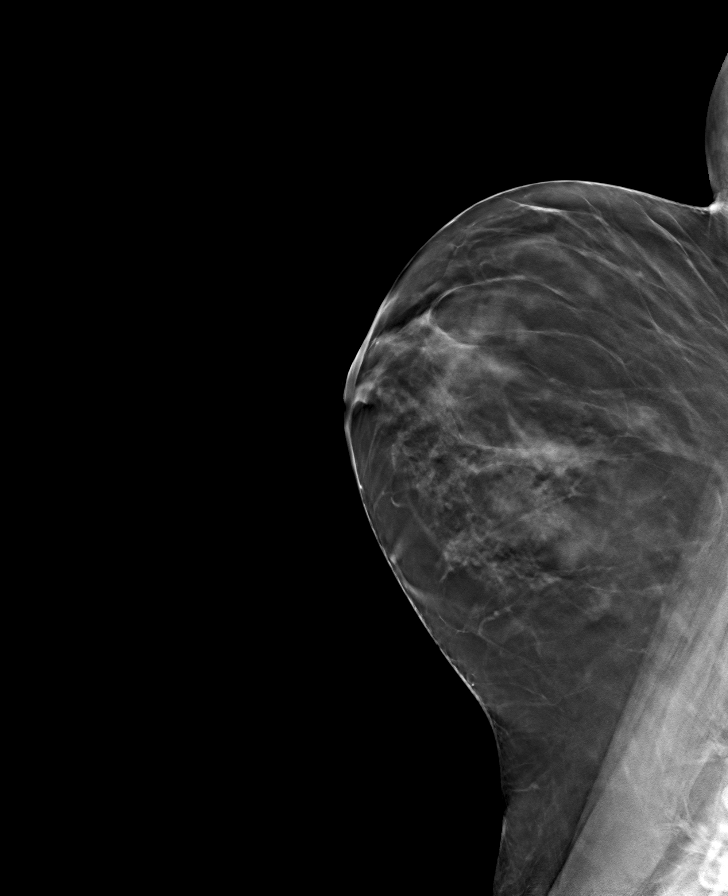

[R MLO tomo · tomo slice 40/79.0]
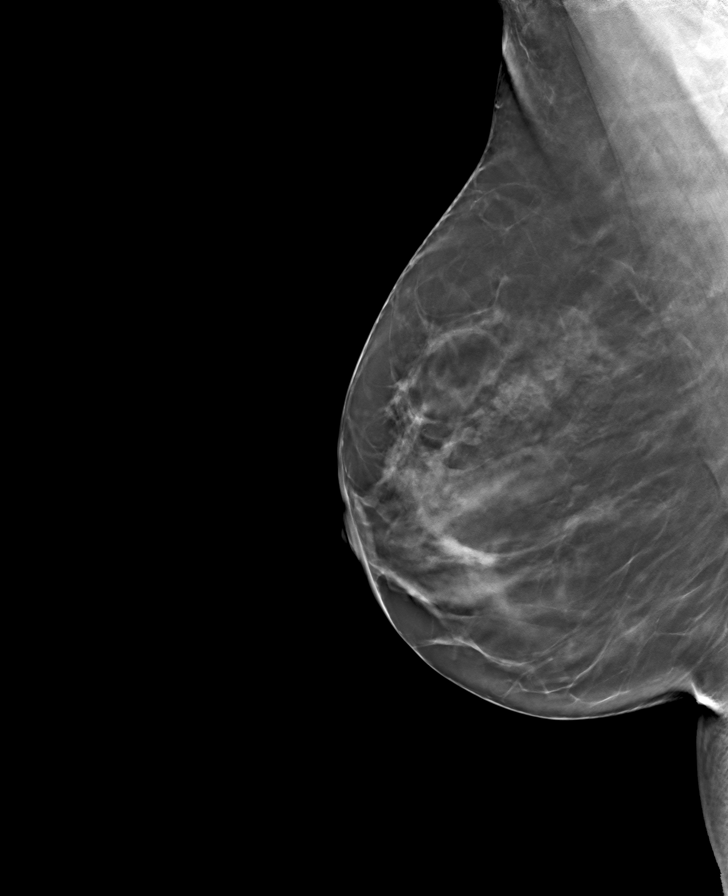

[L CC tomo · tomo slice 39/78.0]
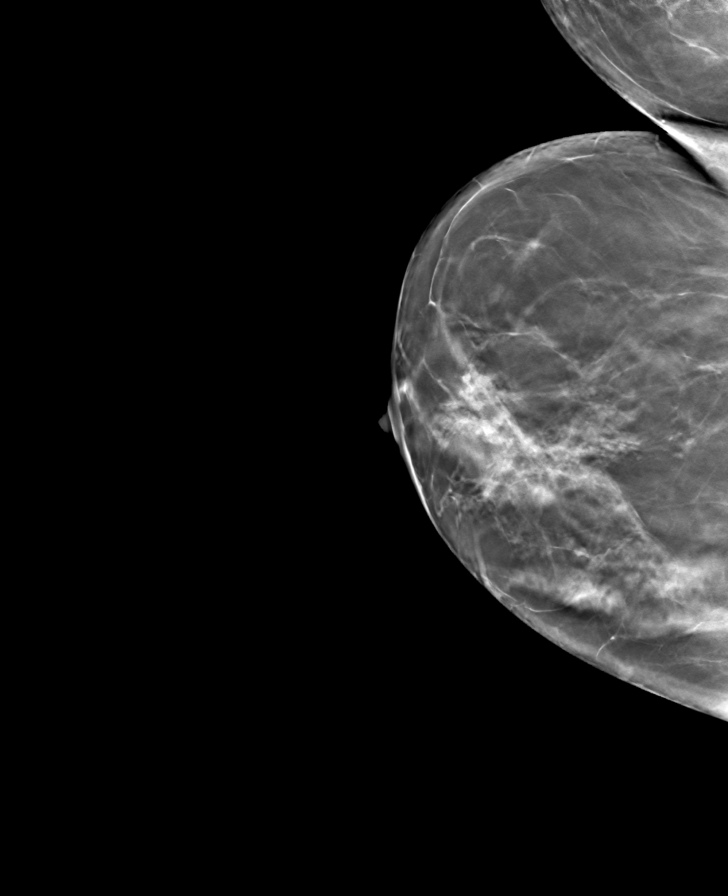

[R CC tomo · tomo slice 37/74.0]
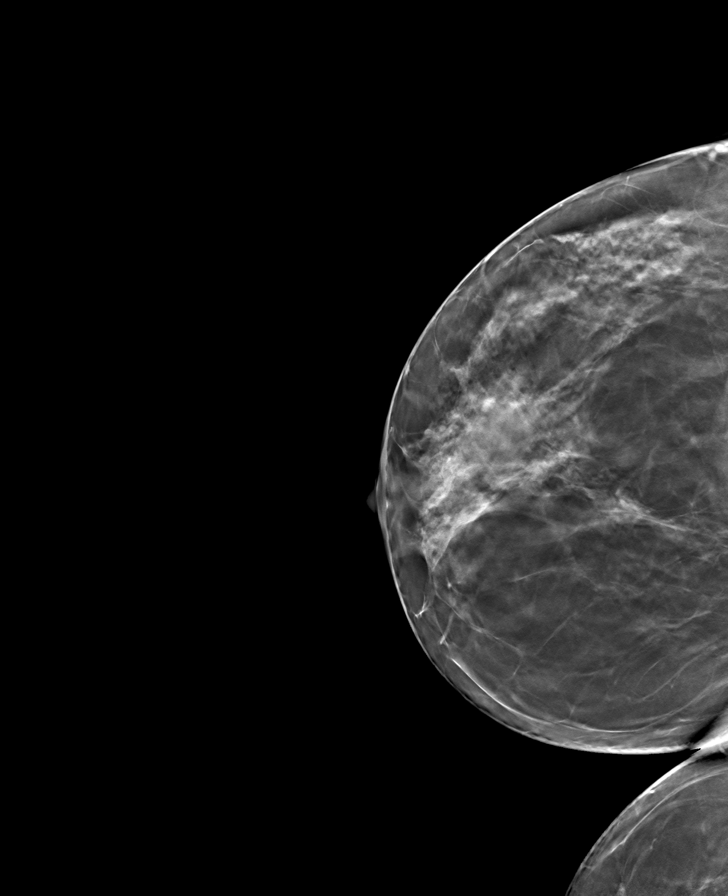

[8 of 24 positions shown; findings below may reference images not displayed]

ACR Breast Density Category c: The breast tissue is heterogeneously
dense, which may obscure small masses.
FINDINGS: There are no findings suspicious for malignancy. Images were
processed with CAD.
IMPRESSION: No mammographic evidence of malignancy. A result letter of this
screening mammogram will be mailed directly to the patient.

RECOMMENDATION:
Screening mammogram in one year. (Code:[id number]-[id number])

BI-RADS CATEGORY  1: Negative.

## 2023-05-19 ENCOUNTER — Encounter: Payer: Self-pay | Admitting: Obstetrics and Gynecology

## 2023-05-19 ENCOUNTER — Other Ambulatory Visit: Payer: Self-pay | Admitting: Obstetrics and Gynecology

## 2023-05-19 DIAGNOSIS — Z1231 Encounter for screening mammogram for malignant neoplasm of breast: Secondary | ICD-10-CM

## 2023-06-09 ENCOUNTER — Ambulatory Visit
Admission: RE | Admit: 2023-06-09 | Discharge: 2023-06-09 | Disposition: A | Discharge status: Home / Self Care | Payer: No Typology Code available for payment source | Source: Ambulatory Visit | Attending: Obstetrics and Gynecology | Admitting: Obstetrics and Gynecology

## 2023-06-09 DIAGNOSIS — Z1231 Encounter for screening mammogram for malignant neoplasm of breast: Secondary | ICD-10-CM | POA: Insufficient documentation

## 2024-05-16 ENCOUNTER — Other Ambulatory Visit: Payer: Self-pay | Admitting: Obstetrics and Gynecology

## 2024-05-16 DIAGNOSIS — Z1231 Encounter for screening mammogram for malignant neoplasm of breast: Secondary | ICD-10-CM

## 2024-07-24 ENCOUNTER — Ambulatory Visit
Admission: RE | Admit: 2024-07-24 | Discharge: 2024-07-24 | Disposition: A | Discharge status: Home / Self Care | Source: Ambulatory Visit | Attending: Obstetrics and Gynecology | Admitting: Obstetrics and Gynecology

## 2024-07-24 DIAGNOSIS — Z1231 Encounter for screening mammogram for malignant neoplasm of breast: Secondary | ICD-10-CM | POA: Diagnosis present

## 2024-08-01 ENCOUNTER — Other Ambulatory Visit: Payer: Self-pay | Admitting: Obstetrics and Gynecology

## 2024-08-01 DIAGNOSIS — R928 Other abnormal and inconclusive findings on diagnostic imaging of breast: Secondary | ICD-10-CM

## 2024-08-06 ENCOUNTER — Other Ambulatory Visit

## 2024-08-06 ENCOUNTER — Encounter
